# Patient Record
Sex: Female | Born: 1960 | Race: Black or African American | Hispanic: No | Marital: Single | Smoking: Former smoker
Health system: Southern US, Community
[De-identification: ages and names within clinical notes are randomized; demographics above are authoritative.]

## PROBLEM LIST (undated history)

## (undated) DIAGNOSIS — R519 Headache, unspecified: Secondary | ICD-10-CM

## (undated) HISTORY — PX: ABLATION: SHX5711

## (undated) HISTORY — PX: UTERINE FIBROID EMBOLIZATION: SHX825

---

## 2018-02-21 ENCOUNTER — Other Ambulatory Visit
Admission: RE | Admit: 2018-02-21 | Discharge: 2018-02-21 | Disposition: A | Discharge status: Home / Self Care | Payer: BLUE CROSS/BLUE SHIELD | Source: Ambulatory Visit | Attending: Pediatrics | Admitting: Pediatrics

## 2018-02-21 DIAGNOSIS — R42 Dizziness and giddiness: Secondary | ICD-10-CM | POA: Diagnosis present

## 2018-02-21 LAB — COMPREHENSIVE METABOLIC PANEL
ALT: 24 U/L (ref 0–44)
AST: 22 U/L (ref 15–41)
Albumin: 4.2 g/dL (ref 3.5–5.0)
Alkaline Phosphatase: 65 U/L (ref 38–126)
Anion gap: 4 — ABNORMAL LOW (ref 5–15)
BUN: 21 mg/dL — ABNORMAL HIGH (ref 6–20)
CO2: 28 mmol/L (ref 22–32)
Calcium: 9.2 mg/dL (ref 8.9–10.3)
Chloride: 109 mmol/L (ref 98–111)
Creatinine, Ser: 0.95 mg/dL (ref 0.44–1.00)
Glucose, Bld: 108 mg/dL — ABNORMAL HIGH (ref 70–99)
Potassium: 4.2 mmol/L (ref 3.5–5.1)
Sodium: 141 mmol/L (ref 135–145)
TOTAL PROTEIN: 7.2 g/dL (ref 6.5–8.1)
Total Bilirubin: 0.5 mg/dL (ref 0.3–1.2)

## 2018-09-07 ENCOUNTER — Other Ambulatory Visit: Payer: Self-pay | Admitting: Internal Medicine

## 2018-09-07 DIAGNOSIS — R109 Unspecified abdominal pain: Secondary | ICD-10-CM

## 2018-09-18 ENCOUNTER — Other Ambulatory Visit: Payer: Self-pay

## 2018-09-18 ENCOUNTER — Ambulatory Visit
Admission: RE | Admit: 2018-09-18 | Discharge: 2018-09-18 | Disposition: A | Discharge status: Home / Self Care | Payer: BC Managed Care – PPO | Source: Ambulatory Visit | Attending: Internal Medicine | Admitting: Internal Medicine

## 2018-09-18 DIAGNOSIS — R109 Unspecified abdominal pain: Secondary | ICD-10-CM

## 2019-02-12 NOTE — H&P (Signed)
Eileen Scott is a 59 y.o. female here for Discuss sono . Follow up from our consultation 2 days ago .  for Lower abdominal pain for the past several years . Pt has crampy , but no vaginal bleeding. + bladder pressure . She is s/p [place] for fibroids 2004 and subsequent Endometrial ablation in 2014 . Marland Kitchen Xray flat plate 1/20 showed calcified mass c/w fibroid. U/s today :   Fibroid ut seen 1 Rt t fundal=44 mm 2 rt post=48 mm 3 post=21 mm 4 lt ant=22 mm   Endometrium=6.61 mm  bil ovs wnl EMBX : insufficient for eval   PAP: neg   Past Medical History:  has a past medical history of Abnormal uterine bleeding, Allergic rhinitis, cause unspecified, Allergic state, Anemia (2017), Bell's palsy, Colon polyps (2010), Constipated, Controlled type 2 diabetes mellitus without complication, without long-term current use of insulin (CMS-HCC), GERD (gastroesophageal reflux disease), Hair loss, Hepatic cyst (2013), HSV (herpes simplex virus) anogenital infection, Hypertension, Insomnia, Lichen planus, Menopausal symptoms, Migraine headache, and Uterine fibroid.  Past Surgical History:  has a past surgical history that includes Uterine fibroid embolization (2004); Colonoscopy (2010); right carpal tunnel release; Gynecologic cryosurgery (1978); Tubal ligation (1995); dental implant; Endometrial ablation (2010); and Hysteroscopy [id number]). Family History: family history includes Alcohol abuse in her maternal uncle; Breast cancer in her maternal aunt; Cancer in her mother and paternal grandmother; Coronary Artery Disease (Blocked arteries around heart) in her paternal grandfather; Diabetes in her maternal grandfather and paternal uncle; Diabetes type II in her maternal grandfather; Esophageal cancer (age of onset: 50) in her father; Heart disease in her father and paternal grandfather; High blood pressure (Hypertension) in her father, mother, paternal uncle, and paternal uncle; Liver cancer in her maternal  uncle; Myocardial Infarction (Heart attack) in her paternal grandfather; Other in her father; Skin cancer in her maternal grandmother; Stroke in her paternal grandfather. Social History:  reports that she quit smoking about 21 years ago. Her smoking use included cigarettes. She quit after 15.00 years of use. She has never used smokeless tobacco. She reports current alcohol use. She reports that she does not use drugs. OB/GYN History:          OB History    Gravida  2   Para  1   Term  1   Preterm      AB  1   Living  1     SAB      TAB      Ectopic      Molar      Multiple      Live Births  1          Allergies: is allergic to ampicillin. Medications:  Current Outpatient Medications:  .  BIOTIN ORAL, Take 5,000 mcg by mouth., Disp: , Rfl:  .  cholecalciferol (VITAMIN D3) 1,000 unit capsule, Take 1,000 Units by mouth once daily., Disp: , Rfl:  .  clobetasol (CLOBEX) 0.05 % shampoo, APPLY TO DRY SCALP AND ALLOW TO REMAIN ON FOR 15 MINUTES, WASH OUT FOLLOWED BY REGUALR SHAMPOO AND/OR CONDITIONER ONCE TO TWICE WEEKLY, Disp: , Rfl: 1 .  finasteride (PROSCAR) 5 mg tablet, Take by mouth, Disp: , Rfl:  .  magnesium oxide (MAG-OX) 400 mg tablet, Take 400 mg by mouth once daily., Disp: , Rfl:  .  MULTIVITAMIN ORAL, Take by mouth once daily., Disp: , Rfl:  .  ascorbic acid, vitamin C, (VITAMIN C) 500 MG tablet, Take 500 mg by mouth once  daily, Disp: , Rfl:  .  fexofenadine (ALLEGRA) 180 MG tablet, Take 180 mg by mouth once daily, Disp: , Rfl:  .  iron, carbonyl 45 mg Tab, Take by mouth, Disp: , Rfl:  .  vitamin B complex (B COMPLEX ORAL), Take by mouth, Disp: , Rfl:   Review of Systems: General:                      No fatigue or weight loss Eyes:                           No vision changes Ears:                            No hearing difficulty Respiratory:                No cough or shortness of breath Pulmonary:                  No asthma or shortness of  breath Cardiovascular:           No chest pain, palpitations, dyspnea on exertion Gastrointestinal:          No abdominal bloating, chronic diarrhea, constipations, masses, pain or hematochezia Genitourinary:             No hematuria, dysuria, abnormal vaginal discharge,,+pelvic pain,+ bladder pressure Lymphatic:                   No swollen lymph nodes Musculoskeletal:         No muscle weakness Neurologic:                  No extremity weakness, syncope, seizure disorder Psychiatric:                  No history of depression, delusions or suicidal/homicidal ideation    Exam:      Vitals:   02/12/19 1511  BP: (!) 153/95    Body mass index is 28.49 kg/m.  WDWN/ black female in NAD   Lungs: CTA  CV : RRR without murmur   Breast: exam done in sitting and lying position : No dimpling or retraction, no dominant mass, no spontaneous discharge, no axillary adenopathy Neck:  no thyromegaly Abdomen: soft , no mass, normal active bowel sounds, non-tender, no rebound tenderness Pelvic: tanner stage 5 ,  External genitalia: vulva /labia no lesions Urethra: no prolapse Vagina: normal physiologic d/c Cervix: no lesions, no cervical motion tenderness  Uterus:14 weeks globular and firm, non-tender Adnexa:no mass, non-tender    Impression:   The encounter diagnosis was Uterine leiomyoma, unspecified location. Fibroid uterus with mass effect causing symptoms    Plan:   After a lengthy discussion the patient has elected for a LSH and BSO . She is aware or the rare event that if she has occult leiomyosarcoma that her cancer maybe upstage given morcellation is used .  Risks from 1/770- 1/10,000 Details of the procedure described and schematic showed . All questions answered . EMBX as above . Possible inadequate sample given prior endometrial ablation   Benefits and risks to surgery: The proposed benefit of the surgery has been discussed with the patient. The possible  risks include, but are not limited to: organ injury to the bowel , bladder, ureters, and major blood vessels and nerves. There is a possibility of additional surgeries resulting  from these injuries. There is also the risk of blood transfusion and the need to receive blood products during or after the procedure which may rarely lead to HIV or Hepatitis C infection. There is a risk of developing a deep venous thrombosis or a pulmonary embolism . There is the possibility of wound infection and also anesthetic complications, even the rare possibility of death. The patient understands these risks and wishes to proceed.       Orders Placed This Encounter  Procedures  . Pathology Report - [facility]    Endometrial Biopsy      Caroline Sauger, MD

## 2019-02-13 ENCOUNTER — Encounter
Admission: RE | Admit: 2019-02-13 | Discharge: 2019-02-13 | Disposition: A | Discharge status: Home / Self Care | Payer: BC Managed Care – PPO | Source: Ambulatory Visit | Attending: Obstetrics and Gynecology | Admitting: Obstetrics and Gynecology

## 2019-02-13 ENCOUNTER — Other Ambulatory Visit: Payer: Self-pay

## 2019-02-13 HISTORY — DX: Headache, unspecified: R51.9

## 2019-02-13 NOTE — Patient Instructions (Signed)
Your procedure is scheduled on: Monday February 19, 2019 Report to Day Surgery on the 2nd floor of the [facility]. To find out your arrival time, please call [phone] between 1PM - 3PM on: Friday February 16, 2019  REMEMBER: Instructions that are not followed completely may result in serious medical risk, up to and including death; or upon the discretion of your surgeon and anesthesiologist your surgery may need to be rescheduled.  Do not eat food after midnight the night before surgery.  No gum chewing, lozengers or hard candies.  You may however, drink CLEAR liquids up to 2 hours before you are scheduled to arrive for your surgery. Do not drink anything within 2 hours of the start of your surgery.  Clear liquids include: - water  - apple juice without pulp -  Clear gatorade - black coffee or tea (Do NOT add milk or creamers to the coffee or tea) Do NOT drink anything that is not on this list.  ENSURE PRE-SURGERY CARBOHYDRATE DRINK:  Complete drinking 2 hours before arriving at hospital  No Alcohol for 24 hours before or after surgery.  No Smoking including e-cigarettes for 24 hours prior to surgery.  No chewable tobacco products for at least 6 hours prior to surgery.  No nicotine patches on the day of surgery.  On the morning of surgery brush your teeth with toothpaste and water, you may rinse your mouth with mouthwash if you wish. Do not swallow any toothpaste or mouthwash.  Notify your doctor if there is any change in your medical condition (cold, fever, infection).  Do not wear jewelry, make-up, hairpins, clips or nail polish.  Do not wear lotions, powders, or perfumes.   Do not shave 48 hours prior to surgery.   Contacts and dentures may not be worn into surgery.  Do not bring valuables to the hospital, including drivers license, insurance or credit cards.  [facility] is not responsible for any belongings or valuables.   TAKE THESE MEDICATIONS THE MORNING OF  SURGERY: none  Use CHG Soap as directed on instruction sheet.  Follow recommendations from Cardiologist, Pulmonologist or PCP regarding stopping Aspirin, Coumadin, Plavix, Eliquis, Pradaxa, or Pletal.  Stop Anti-inflammatories (NSAIDS) such as Advil, Aleve, Ibuprofen, Motrin, Naproxen, Naprosyn and Aspirin based products such as Excedrin, Goodys Powder, BC Powder. (May take Tylenol or Acetaminophen if needed.)  Stop ANY OVER THE COUNTER supplements until after surgery. (May continue Vitamin D, Vitamin B, and multivitamin.)  Wear comfortable clothing (specific to your surgery type) to the hospital.  Plan for stool softeners for home use.  If you are being discharged the day of surgery, you will not be allowed to drive home. You will need a responsible adult to drive you home and stay with you that night.   If you are taking public transportation, you will need to have a responsible adult with you. Please confirm with your physician that it is acceptable to use public transportation.   Please call [phone] if you have any questions about these instructions.

## 2019-02-15 ENCOUNTER — Other Ambulatory Visit: Payer: Self-pay

## 2019-02-15 ENCOUNTER — Other Ambulatory Visit
Admission: RE | Admit: 2019-02-15 | Discharge: 2019-02-15 | Disposition: A | Discharge status: Home / Self Care | Payer: BC Managed Care – PPO | Source: Ambulatory Visit | Attending: Obstetrics and Gynecology | Admitting: Obstetrics and Gynecology

## 2019-02-15 DIAGNOSIS — Z01811 Encounter for preprocedural respiratory examination: Secondary | ICD-10-CM | POA: Insufficient documentation

## 2019-02-15 DIAGNOSIS — Z20822 Contact with and (suspected) exposure to covid-19: Secondary | ICD-10-CM | POA: Diagnosis not present

## 2019-02-15 LAB — BASIC METABOLIC PANEL
Anion gap: 10 (ref 5–15)
BUN: 14 mg/dL (ref 6–20)
CO2: 28 mmol/L (ref 22–32)
Calcium: 9.6 mg/dL (ref 8.9–10.3)
Chloride: 102 mmol/L (ref 98–111)
Creatinine, Ser: 0.96 mg/dL (ref 0.44–1.00)
GFR calc Af Amer: >60 mL/min (ref >60)
GFR calc non Af Amer: >60 mL/min (ref >60)
Glucose, Bld: 110 mg/dL — ABNORMAL HIGH (ref 70–99)
Potassium: 4.1 mmol/L (ref 3.5–5.1)
Sodium: 140 mmol/L (ref 135–145)

## 2019-02-15 LAB — TYPE AND SCREEN
ABO/RH(D): O POS
Antibody Screen: NEGATIVE

## 2019-02-15 LAB — CBC
HCT: 45 % (ref 36.0–46.0)
Hemoglobin: 15.2 g/dL — ABNORMAL HIGH (ref 12.0–15.0)
MCH: 29.5 pg (ref 26.0–34.0)
MCHC: 33.8 g/dL (ref 30.0–36.0)
MCV: 87.2 fL (ref 80.0–100.0)
Platelets: 248 10*3/uL (ref 150–400)
RBC: 5.16 MIL/uL — ABNORMAL HIGH (ref 3.87–5.11)
RDW: 12.4 % (ref 11.5–15.5)
WBC: 4 10*3/uL (ref 4.0–10.5)
nRBC: 0 % (ref 0.0–0.2)

## 2019-02-16 LAB — SARS CORONAVIRUS 2 (TAT 6-24 HRS): SARS Coronavirus 2: NEGATIVE

## 2019-02-19 ENCOUNTER — Encounter: Payer: Self-pay | Admitting: Obstetrics and Gynecology

## 2019-02-19 ENCOUNTER — Ambulatory Visit
Admission: RE | Admit: 2019-02-19 | Discharge: 2019-02-19 | Disposition: A | Discharge status: Home / Self Care | Payer: BC Managed Care – PPO | Attending: Obstetrics and Gynecology | Admitting: Obstetrics and Gynecology

## 2019-02-19 ENCOUNTER — Ambulatory Visit: Payer: BC Managed Care – PPO | Admitting: Certified Registered Nurse Anesthetist

## 2019-02-19 ENCOUNTER — Other Ambulatory Visit: Payer: Self-pay

## 2019-02-19 ENCOUNTER — Encounter
Admission: RE | Disposition: A | Discharge status: Home / Self Care | Payer: Self-pay | Source: Home / Self Care | Attending: Obstetrics and Gynecology

## 2019-02-19 DIAGNOSIS — E119 Type 2 diabetes mellitus without complications: Secondary | ICD-10-CM | POA: Diagnosis not present

## 2019-02-19 DIAGNOSIS — Z87891 Personal history of nicotine dependence: Secondary | ICD-10-CM | POA: Insufficient documentation

## 2019-02-19 DIAGNOSIS — J309 Allergic rhinitis, unspecified: Secondary | ICD-10-CM | POA: Insufficient documentation

## 2019-02-19 DIAGNOSIS — D259 Leiomyoma of uterus, unspecified: Secondary | ICD-10-CM | POA: Diagnosis present

## 2019-02-19 DIAGNOSIS — I10 Essential (primary) hypertension: Secondary | ICD-10-CM | POA: Insufficient documentation

## 2019-02-19 DIAGNOSIS — Z79899 Other long term (current) drug therapy: Secondary | ICD-10-CM | POA: Diagnosis not present

## 2019-02-19 DIAGNOSIS — D649 Anemia, unspecified: Secondary | ICD-10-CM | POA: Diagnosis not present

## 2019-02-19 HISTORY — PX: LAPAROSCOPIC SUPRACERVICAL HYSTERECTOMY: SHX5399

## 2019-02-19 HISTORY — PX: LAPAROSCOPIC BILATERAL SALPINGO OOPHERECTOMY: SHX5890

## 2019-02-19 LAB — ABO/RH: ABO/RH(D): O POS

## 2019-02-19 LAB — GLUCOSE, CAPILLARY: Glucose-Capillary: 151 mg/dL — ABNORMAL HIGH (ref 70–99)

## 2019-02-19 SURGERY — HYSTERECTOMY, SUPRACERVICAL, LAPAROSCOPIC
Anesthesia: General | Site: Abdomen

## 2019-02-19 MED ORDER — SILVER NITRATE-POT NITRATE 75-25 % EX MISC
CUTANEOUS | Status: AC
  Filled 2019-02-19: qty 20

## 2019-02-19 MED ORDER — CEFAZOLIN SODIUM-DEXTROSE 2-4 GM/100ML-% IV SOLN
INTRAVENOUS | Status: AC
  Filled 2019-02-19: qty 100

## 2019-02-19 MED ORDER — DEXAMETHASONE SODIUM PHOSPHATE 10 MG/ML IJ SOLN
INTRAMUSCULAR | Status: DC | PRN
  Administered 2019-02-19: 10 mg via INTRAVENOUS

## 2019-02-19 MED ORDER — ROCURONIUM BROMIDE 100 MG/10ML IV SOLN
INTRAVENOUS | Status: DC | PRN
  Administered 2019-02-19: 10 mg via INTRAVENOUS
  Administered 2019-02-19: 40 mg via INTRAVENOUS

## 2019-02-19 MED ORDER — ONDANSETRON HCL 4 MG/2ML IJ SOLN
INTRAMUSCULAR | Status: DC | PRN
  Administered 2019-02-19: 4 mg via INTRAVENOUS

## 2019-02-19 MED ORDER — ACETAMINOPHEN 500 MG PO TABS
ORAL_TABLET | ORAL | Status: AC
  Administered 2019-02-19: 1000 mg via ORAL
  Filled 2019-02-19: qty 2

## 2019-02-19 MED ORDER — CEFAZOLIN SODIUM-DEXTROSE 2-4 GM/100ML-% IV SOLN
2.0000 g | Freq: Once | INTRAVENOUS | Status: AC
  Administered 2019-02-19: 2 g via INTRAVENOUS

## 2019-02-19 MED ORDER — KETOROLAC TROMETHAMINE 30 MG/ML IJ SOLN
INTRAMUSCULAR | Status: DC | PRN
  Administered 2019-02-19: 15 mg via INTRAVENOUS

## 2019-02-19 MED ORDER — FAMOTIDINE 20 MG PO TABS
ORAL_TABLET | ORAL | Status: AC
  Filled 2019-02-19: qty 1

## 2019-02-19 MED ORDER — ACETAMINOPHEN 10 MG/ML IV SOLN
INTRAVENOUS | Status: DC | PRN
  Administered 2019-02-19: 1000 mg via INTRAVENOUS

## 2019-02-19 MED ORDER — FAMOTIDINE 20 MG PO TABS
20.0000 mg | ORAL_TABLET | Freq: Once | ORAL | Status: AC
  Administered 2019-02-19: 20 mg via ORAL

## 2019-02-19 MED ORDER — MIDAZOLAM HCL 2 MG/2ML IJ SOLN
INTRAMUSCULAR | Status: DC | PRN
  Administered 2019-02-19: 2 mg via INTRAVENOUS

## 2019-02-19 MED ORDER — ACETAMINOPHEN 10 MG/ML IV SOLN
INTRAVENOUS | Status: AC
  Filled 2019-02-19: qty 100

## 2019-02-19 MED ORDER — FENTANYL CITRATE (PF) 100 MCG/2ML IJ SOLN
INTRAMUSCULAR | Status: AC
  Filled 2019-02-19: qty 2

## 2019-02-19 MED ORDER — ACETAMINOPHEN 500 MG PO TABS: 1000.0000 mg | ORAL_TABLET | ORAL | Status: AC

## 2019-02-19 MED ORDER — LIDOCAINE HCL (CARDIAC) PF 100 MG/5ML IV SOSY
PREFILLED_SYRINGE | INTRAVENOUS | Status: DC | PRN
  Administered 2019-02-19: 80 mg via INTRAVENOUS

## 2019-02-19 MED ORDER — PHENYLEPHRINE HCL (PRESSORS) 10 MG/ML IV SOLN
INTRAVENOUS | Status: DC | PRN
  Administered 2019-02-19 (×2): 100 ug via INTRAVENOUS

## 2019-02-19 MED ORDER — GABAPENTIN 300 MG PO CAPS
300.0000 mg | ORAL_CAPSULE | ORAL | Status: AC
  Administered 2019-02-19: 300 mg via ORAL

## 2019-02-19 MED ORDER — MIDAZOLAM HCL 2 MG/2ML IJ SOLN
INTRAMUSCULAR | Status: AC
  Filled 2019-02-19: qty 2

## 2019-02-19 MED ORDER — FENTANYL CITRATE (PF) 100 MCG/2ML IJ SOLN: 25.0000 ug | INTRAMUSCULAR | Status: DC | PRN

## 2019-02-19 MED ORDER — SUGAMMADEX SODIUM 200 MG/2ML IV SOLN
INTRAVENOUS | Status: DC | PRN
  Administered 2019-02-19: 142.4 mg via INTRAVENOUS

## 2019-02-19 MED ORDER — ONDANSETRON HCL 4 MG/2ML IJ SOLN: 4.0000 mg | Freq: Once | INTRAMUSCULAR | Status: DC | PRN

## 2019-02-19 MED ORDER — BUPIVACAINE HCL (PF) 0.5 % IJ SOLN
INTRAMUSCULAR | Status: AC
  Filled 2019-02-19: qty 30

## 2019-02-19 MED ORDER — LACTATED RINGERS IV SOLN: INTRAVENOUS | Status: DC

## 2019-02-19 MED ORDER — FENTANYL CITRATE (PF) 100 MCG/2ML IJ SOLN
INTRAMUSCULAR | Status: DC | PRN
  Administered 2019-02-19: 100 ug via INTRAVENOUS

## 2019-02-19 MED ORDER — BUPIVACAINE HCL 0.5 % IJ SOLN
INTRAMUSCULAR | Status: DC | PRN
  Administered 2019-02-19: 15 mL

## 2019-02-19 MED ORDER — SODIUM CHLORIDE 0.9 % IV SOLN
INTRAVENOUS | Status: DC | PRN
  Administered 2019-02-19: 10:00:00 40 ug/min via INTRAVENOUS

## 2019-02-19 MED ORDER — GABAPENTIN 300 MG PO CAPS
ORAL_CAPSULE | ORAL | Status: AC
  Filled 2019-02-19: qty 1

## 2019-02-19 MED ORDER — SILVER NITRATE-POT NITRATE 75-25 % EX MISC
CUTANEOUS | Status: DC | PRN
  Administered 2019-02-19: 1

## 2019-02-19 MED ORDER — PROPOFOL 10 MG/ML IV BOLUS
INTRAVENOUS | Status: DC | PRN
  Administered 2019-02-19: 150 mg via INTRAVENOUS

## 2019-02-19 SURGICAL SUPPLY — 56 items
ANCHOR TIS RET SYS 1550ML (BAG) ×3 IMPLANT
BAG URINE DRAIN 2000ML AR STRL (UROLOGICAL SUPPLIES) ×3 IMPLANT
BLADE OSC/SAGITTAL MD 5.5X18 (BLADE) ×3 IMPLANT
BLADE SAW 2.5X18X0.5 (BLADE) ×3 IMPLANT
BLADE SURG SZ11 CARB STEEL (BLADE) ×12 IMPLANT
CANISTER SUCT 1200ML W/VALVE (MISCELLANEOUS) ×3 IMPLANT
CATH FOLEY 2WAY  5CC 16FR (CATHETERS) ×1
CATH ROBINSON RED A/P 16FR (CATHETERS) ×3 IMPLANT
CATH URTH 16FR FL 2W BLN LF (CATHETERS) ×2 IMPLANT
CHLORAPREP W/TINT 26 (MISCELLANEOUS) ×3 IMPLANT
COVER WAND RF STERILE (DRAPES) ×3 IMPLANT
DERMABOND ADVANCED (GAUZE/BANDAGES/DRESSINGS) ×1
DERMABOND ADVANCED .7 DNX12 (GAUZE/BANDAGES/DRESSINGS) ×2 IMPLANT
DRSG TEGADERM 2-3/8X2-3/4 SM (GAUZE/BANDAGES/DRESSINGS) ×3 IMPLANT
GLOVE BIO SURGEON STRL SZ8 (GLOVE) ×15 IMPLANT
GOWN STRL REUS W/ TWL LRG LVL3 (GOWN DISPOSABLE) ×4 IMPLANT
GOWN STRL REUS W/ TWL XL LVL3 (GOWN DISPOSABLE) ×2 IMPLANT
GOWN STRL REUS W/TWL LRG LVL3 (GOWN DISPOSABLE) ×2
GOWN STRL REUS W/TWL XL LVL3 (GOWN DISPOSABLE) ×1
GRASPER SUT TROCAR 14GX15 (MISCELLANEOUS) ×6 IMPLANT
IRRIGATION STRYKERFLOW (MISCELLANEOUS) ×2 IMPLANT
IRRIGATOR STRYKERFLOW (MISCELLANEOUS) ×3
IV LACTATED RINGERS 1000ML (IV SOLUTION) IMPLANT
IV NS 1000ML (IV SOLUTION)
IV NS 1000ML BAXH (IV SOLUTION) IMPLANT
KIT PINK PAD W/HEAD ARE REST (MISCELLANEOUS) ×3
KIT PINK PAD W/HEAD ARM REST (MISCELLANEOUS) ×2 IMPLANT
KIT TURNOVER CYSTO (KITS) ×3 IMPLANT
KITTNER LAPARASCOPIC 5X40 (MISCELLANEOUS) ×3 IMPLANT
LABEL OR SOLS (LABEL) ×3 IMPLANT
MORCELLATOR XCISE  COR (MISCELLANEOUS) ×1
MORCELLATOR XCISE COR (MISCELLANEOUS) ×2 IMPLANT
NS IRRIG 500ML POUR BTL (IV SOLUTION) ×3 IMPLANT
PACK GYN LAPAROSCOPIC (MISCELLANEOUS) ×3 IMPLANT
PAD OB MATERNITY 4.3X12.25 (PERSONAL CARE ITEMS) ×3 IMPLANT
PAD PREP 24X41 OB/GYN DISP (PERSONAL CARE ITEMS) ×3 IMPLANT
POUCH SPECIMEN RETRIEVAL 10MM (ENDOMECHANICALS) IMPLANT
RETRACTOR RING XSMALL (MISCELLANEOUS) ×2 IMPLANT
RTRCTR WOUND ALEXIS 13CM XS SH (MISCELLANEOUS) ×3
SET CYSTO W/LG BORE CLAMP LF (SET/KITS/TRAYS/PACK) IMPLANT
SET TUBE SMOKE EVAC HIGH FLOW (TUBING) ×3 IMPLANT
SHEARS HARMONIC ACE PLUS 36CM (ENDOMECHANICALS) ×3 IMPLANT
SLEEVE ENDOPATH XCEL 5M (ENDOMECHANICALS) ×3 IMPLANT
SOLUTION ELECTROLUBE (MISCELLANEOUS) ×3 IMPLANT
SPONGE GAUZE 2X2 8PLY STRL LF (GAUZE/BANDAGES/DRESSINGS) ×3 IMPLANT
STRIP CLOSURE SKIN 1/2X4 (GAUZE/BANDAGES/DRESSINGS) IMPLANT
STRIP CLOSURE SKIN 1/4X4 (GAUZE/BANDAGES/DRESSINGS) ×3 IMPLANT
SUT VIC AB 0 CT1 36 (SUTURE) ×3 IMPLANT
SUT VIC AB 2-0 UR6 27 (SUTURE) ×6 IMPLANT
SUT VIC AB 4-0 SH 27 (SUTURE) ×2
SUT VIC AB 4-0 SH 27XANBCTRL (SUTURE) ×4 IMPLANT
SWABSTK COMLB BENZOIN TINCTURE (MISCELLANEOUS) ×3 IMPLANT
SYR 10ML LL (SYRINGE) ×3 IMPLANT
SYR 20ML LL LF (SYRINGE) ×3 IMPLANT
TROCAR ENDO BLADELESS 11MM (ENDOMECHANICALS) ×3 IMPLANT
TROCAR XCEL NON-BLD 5MMX100MML (ENDOMECHANICALS) ×3 IMPLANT

## 2019-02-19 NOTE — Brief Op Note (Signed)
02/19/2019  12:02 PM  PATIENT:  Eileen Scott  59 y.o. female  PRE-OPERATIVE DIAGNOSIS:  symptomatic fibroids  POST-OPERATIVE DIAGNOSIS:  Symptomatic calcified fibroids  PROCEDURE:  Procedure(s): LAPAROSCOPIC SUPRACERVICAL HYSTERECTOMY (N/A) LAPAROSCOPIC BILATERAL SALPINGO OOPHORECTOMY (Bilateral)  SURGEON:  Surgeon(s) and Role:    * Quenton Recendez, Gwen Her, MD - Primary    * Benjaman Kindler, MD - Assisting  PHYSICIAN ASSISTANT:   ASSISTANTS: none   ANESTHESIA:   general  EBL:  25 mL   BLOOD ADMINISTERED:none  DRAINS: none   LOCAL MEDICATIONS USED:  MARCAINE     SPECIMEN:  Source of Specimen:  uterus with calified morcellated fibroids, bilateral fallopian tubes and ovaries   DISPOSITION OF SPECIMEN:  PATHOLOGY  COUNTS:  YES  TOURNIQUET:  * No tourniquets in log *  DICTATION: .Other Dictation: Dictation Number verbal  PLAN OF CARE: Discharge to home after PACU  PATIENT DISPOSITION:  PACU - hemodynamically stable.   Delay start of Pharmacological VTE agent (>24hrs) due to surgical blood loss or risk of bleeding: not applicable

## 2019-02-19 NOTE — Anesthesia Preprocedure Evaluation (Signed)
Anesthesia Evaluation  Patient identified by MRN, date of birth, ID band Patient awake    Reviewed: Allergy & Precautions, NPO status , Patient's Chart, lab work & pertinent test results  Airway Mallampati: III       Dental   Pulmonary former smoker,    Pulmonary exam normal        Cardiovascular negative cardio ROS Normal cardiovascular exam     Neuro/Psych  Headaches, negative psych ROS   GI/Hepatic negative GI ROS, Neg liver ROS,   Endo/Other  negative endocrine ROS  Renal/GU negative Renal ROS  Female GU complaint     Musculoskeletal negative musculoskeletal ROS (+)   Abdominal   Peds negative pediatric ROS (+)  Hematology negative hematology ROS (+)   Anesthesia Other Findings   Reproductive/Obstetrics                             Anesthesia Physical Anesthesia Plan  ASA: II  Anesthesia Plan: General   Post-op Pain Management:    Induction: Intravenous  PONV Risk Score and Plan:   Airway Management Planned: Oral ETT  Additional Equipment:   Intra-op Plan:   Post-operative Plan: Extubation in OR  Informed Consent: I have reviewed the patients History and Physical, chart, labs and discussed the procedure including the risks, benefits and alternatives for the proposed anesthesia with the patient or authorized representative who has indicated his/her understanding and acceptance.       Plan Discussed with: CRNA and Surgeon  Anesthesia Plan Comments:         Anesthesia Quick Evaluation

## 2019-02-19 NOTE — Anesthesia Procedure Notes (Signed)
Procedure Name: Intubation Date/Time: 02/19/2019 9:13 AM Performed by: Willette Alma, CRNA Pre-anesthesia Checklist: Patient identified, Patient being monitored, Timeout performed, Emergency Drugs available and Suction available Patient Re-evaluated:Patient Re-evaluated prior to induction Oxygen Delivery Method: Circle system utilized Preoxygenation: Pre-oxygenation with 100% oxygen Induction Type: IV induction Ventilation: Mask ventilation without difficulty Laryngoscope Size: Mac and 3 Grade View: Grade II Tube type: Oral Tube size: 7.0 mm Number of attempts: 1 Airway Equipment and Method: Stylet Placement Confirmation: ETT inserted through vocal cords under direct vision,  positive ETCO2 and breath sounds checked- equal and bilateral Secured at: 21 cm Tube secured with: Tape Dental Injury: Teeth and Oropharynx as per pre-operative assessment  Comments: Atraumatic intubation. Lips and teeth as before. Soft bite block in place after ETT secured.

## 2019-02-19 NOTE — Progress Notes (Signed)
Ready for [facility] and BSO . Labs reviewed . All questions answered . Proceed

## 2019-02-19 NOTE — Op Note (Signed)
NAME: Eileen Scott, MAUSS MEDICAL RECORD [id number] ACCOUNT [id number] DATE OF BIRTH:02-May-1960 FACILITY: [facility] LOCATION: [facility] PHYSICIAN:Odile Veloso Josefine Class, MD  OPERATIVE REPORT  DATE OF PROCEDURE:  02/19/2019  PREOPERATIVE DIAGNOSIS:  Symptomatic fibroid uterus.  POSTOPERATIVE DIAGNOSIS:  Symptomatic calcified fibroid uterus.  PROCEDURE: 1.  Laparoscopic supracervical hysterectomy with bilateral salpingo-oophorectomy. 2.  Morcellation of calcified fibroids with Total Performance System as portion of the morcellation.  SURGEON:  Laverta Baltimore, MD.  FIRST ASSISTANT:  Benjaman Kindler, MD.  ANESTHESIA:  General endotracheal anesthesia.  INDICATIONS:  A 59 year old female who had previously undergone uterine artery embolization in 2004 and subsequent endometrial ablation in 2014.  The patient had continuous pelvic pressure and abdominal pain secondary to fibroid uterus with large  calcification of fibroids.  DESCRIPTION OF PROCEDURE:  After adequate general endotracheal anesthesia, the patient was placed in dorsal supine position, legs placed in the [place].  The patient's abdomen, perineum and vagina were prepped and draped in normal sterile fashion.   Then, a timeout was performed.  The patient did receive 2 g IV Ancef prior to commencement of the case.  Foley catheter was placed during the procedure yielded an initial 100 mL of clear urine.  A speculum was placed in the anterior cervix was grasped  with a single-tooth tenaculum and a uterine sound was placed into the endometrial cavity to be used for uterine manipulation during the procedure.  Gloves were changed.  Attention was directed to the patient's abdomen.  A 5 mm infraumbilical incision was  made and the 5 mm laparoscope was advanced into the abdominal cavity under direct visualization with the Optiview cannula.  Second port site was placed in the left lower quadrant 3 cm medial to the left anterior  iliac spine.  An 11 mm trocar was  advanced under direct visualization.  A third port site was placed on the right lower quadrant, again 3 cm medial to the right anterior iliac spine.  A 5 mm trocar was advanced under direct visualization.  Initial impression revealed a 13-14 week size  uterus with 2 large prominent fibroids, very firm with manipulation with the graspers.  The ureters were identified bilaterally with normal peristaltic activity.  Of note, the sigmoid colon had some increased pigmentation on the serosal surface.   Attention was directed to the patient's left adnexa.  The left infundibulopelvic ligament was cauterized with Kleppinger cautery and then dissected with the Harmonic scalpel.  Broad ligament on the left was opened.  Ultimately, the left uterine artery  was skeletonized and cauterized and transected.  Bladder flap was created and the bladder was reflected off the lower cervix.  Similar procedure was repeated on the patient's right side.  Again, the right infundibulopelvic ligament was cauterized and  transected.  The right broad ligament was opened and the right uterine artery was skeletonized, cauterized and transected.  Harmonic scalpel was then used to transect the cervix at the level of the uterosacral ligaments.  The uterine sound was then  removed and the remaining endocervical canal was cauterized with the Kleppinger.  The morcellator was then brought up and advanced into the left lower port site.  Morcellation took place and ultimately the 2 fibroids, approximately 5 and 4 cm each were  left behind given the morcellator could not morcellate the rock-hard fibroids.  An Endobag was brought up into the field and the 2 large fibroids and 1 smaller calcified fibroid were placed in the bag and brought up to the left lower port  site.  An  Ubaldo Glassing was brought in to aid in visualization.  The fibroids were grasped with a double-tooth tenaculum and a scalpel could not penetrate the  cortex of this calcified fibroid.  Orthopedic Total Performance System bone cutter was then utilized to help  dissect and morcellate these calcified fibroids.  This morcellation took 100 minutes out of the total operating time of 140 minutes.  Ultimately, the 3 fibroids that were in the bag were brought through the lower port site and the bag was removed.  The  left approximately 4 cm incision was then closed with a 2-0 Vicryl suture to incorporate the fascia.  The patient's abdomen was reinsufflated and there were no defects in the fascia noted.  The patient's abdomen was copiously irrigated and there was one  small 1 x 1 cm calcified remnant that was identified.  A #11 trocar was placed through the right lower port site to facilitate with the removal of this piece.  This was accomplished and the ureters again showed normal peristaltic activity.  Upper abdomen  appeared normal.  The Carter-Thomason apparatus was brought up and the right lower port site fascia was closed with the PMI with a 2-0 Vicryl suture.  Good repair of the defect.  Pictures were taken.  Pressure was lowered to 7 mmHg and good hemostasis  was noted.  The patient's abdomen was deflated and the ports sites were closed with interrupted 4-0 Vicryl and the left lower port site was closed with a 4-0 Vicryl subcuticular stitch.  Good cosmetic effect.  Dressings applied.  The Foley catheter was  removed with a total urine output of  150 mL, including what was removed at the beginning of the case.  The anterior tenaculum was removed from the cervix and silver nitrate was used to control hemostasis.  Good hemostasis was noted.  COMPLICATIONS:  There were no complications.  ESTIMATED BLOOD LOSS:  25 mL.  INTRAOPERATIVE FLUIDS:  700 mL.  URINE OUTPUT:  150 mL.  The patient did receive 30 mg intravenous Toradol at the end of the case.  DISPOSITION:  The patient was taken to recovery room in good condition.  VN/NUANCE  D:02/19/2019  T:02/19/2019 JOB:[id number]

## 2019-02-19 NOTE — Transfer of Care (Signed)
Immediate Anesthesia Transfer of Care Note  Patient: Eileen Scott  Procedure(s) Performed: LAPAROSCOPIC SUPRACERVICAL HYSTERECTOMY (N/A Abdomen) LAPAROSCOPIC BILATERAL SALPINGO OOPHORECTOMY (Bilateral )  Patient Location: PACU  Anesthesia Type:General  Level of Consciousness: awake, alert  and oriented  Airway & Oxygen Therapy: Patient Spontanous Breathing and Patient connected to face mask oxygen  Post-op Assessment: Report given to RN and Post -op Vital signs reviewed and stable  Post vital signs: Reviewed and stable  Last Vitals:  Vitals Value Taken Time  BP 129/58 02/19/19 1215  Temp    Pulse 88 02/19/19 1216  Resp 12 02/19/19 1216  SpO2 100 % 02/19/19 1216  Vitals shown include unvalidated device data.  Last Pain:  Vitals:   02/19/19 0829  TempSrc: Tympanic  PainSc: 0-No pain         Complications: No apparent anesthesia complications

## 2019-02-19 NOTE — Discharge Instructions (Signed)

## 2019-02-20 NOTE — Anesthesia Postprocedure Evaluation (Signed)
Anesthesia Post Note  Patient: Melia B Campione  Procedure(s) Performed: LAPAROSCOPIC SUPRACERVICAL HYSTERECTOMY (N/A Abdomen) LAPAROSCOPIC BILATERAL SALPINGO OOPHORECTOMY (Bilateral )  Patient location during evaluation: PACU Anesthesia Type: General Level of consciousness: awake and alert and oriented Pain management: pain level controlled Vital Signs Assessment: post-procedure vital signs reviewed and stable Respiratory status: spontaneous breathing Cardiovascular status: blood pressure returned to baseline Anesthetic complications: no     Last Vitals:  Vitals:   02/19/19 1345 02/19/19 1424  BP: 104/85 119/61  Pulse: 83 82  Resp: 12 18  Temp: 36.6 C   SpO2: 100% 100%    Last Pain:  Vitals:   02/20/19 0828  TempSrc:   PainSc: 1                  Tyshon Fanning

## 2019-02-22 LAB — SURGICAL PATHOLOGY

## 2019-06-18 ENCOUNTER — Other Ambulatory Visit: Payer: Self-pay | Admitting: Family Medicine

## 2019-06-18 DIAGNOSIS — R413 Other amnesia: Secondary | ICD-10-CM

## 2019-06-18 DIAGNOSIS — G8929 Other chronic pain: Secondary | ICD-10-CM

## 2019-06-18 DIAGNOSIS — R42 Dizziness and giddiness: Secondary | ICD-10-CM

## 2019-06-29 ENCOUNTER — Other Ambulatory Visit: Payer: Self-pay

## 2019-06-29 ENCOUNTER — Ambulatory Visit
Admission: RE | Admit: 2019-06-29 | Discharge: 2019-06-29 | Disposition: A | Discharge status: Home / Self Care | Payer: BC Managed Care – PPO | Source: Ambulatory Visit | Attending: Family Medicine | Admitting: Family Medicine

## 2019-06-29 DIAGNOSIS — R42 Dizziness and giddiness: Secondary | ICD-10-CM | POA: Insufficient documentation

## 2019-06-29 DIAGNOSIS — R413 Other amnesia: Secondary | ICD-10-CM

## 2019-06-29 DIAGNOSIS — G8929 Other chronic pain: Secondary | ICD-10-CM | POA: Diagnosis present

## 2019-06-29 DIAGNOSIS — R519 Headache, unspecified: Secondary | ICD-10-CM | POA: Diagnosis present

## 2019-06-29 MED ORDER — GADOBUTROL 1 MMOL/ML IV SOLN
7.0000 mL | Freq: Once | INTRAVENOUS | Status: AC | PRN
  Administered 2019-06-29: 7 mL via INTRAVENOUS

## 2021-09-21 IMAGING — MR MR HEAD WO/W CM
14 series · 48 of 48 positions shown · IV contrast (gadavist)
Comparison: None.

CLINICAL DATA: Chronic headaches.

EXAM:
MRI HEAD WITHOUT AND WITH CONTRAST
TECHNIQUE: Multiplanar, multiecho pulse sequences of the brain and surrounding
structures were obtained without and with intravenous contrast.
CONTRAST:  7mL GADAVIST GADOBUTROL 1 MMOL/ML IV SOLN

[Series 5: ax dwi_tracew · axial · 3.0mm · 0.60mm/px · z∈[-147,+6]mm · 2 of 48 slices shown]
[im 1/48]
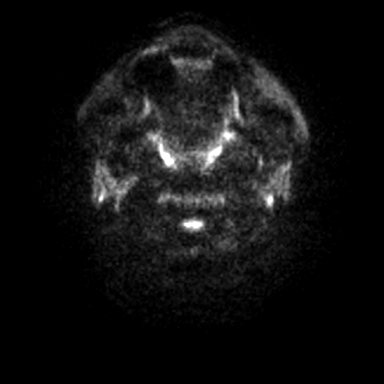
[im 48/48]
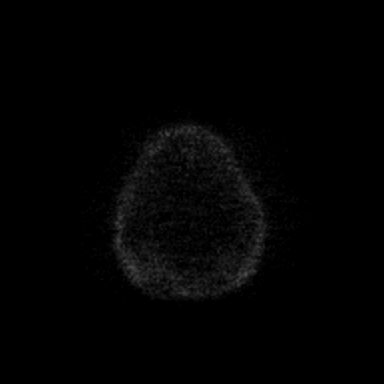

[Series 6: ax dwi_adc · axial · 3.0mm · 0.60mm/px · z∈[-147,+6]mm · 3 of 48 slices shown]
[im 1/48]
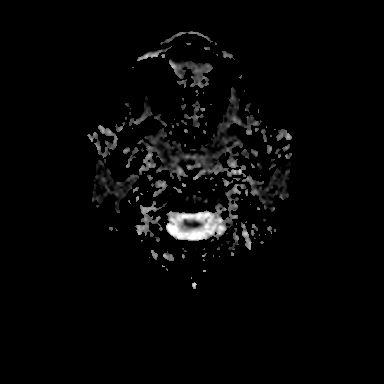
[im 24/48]
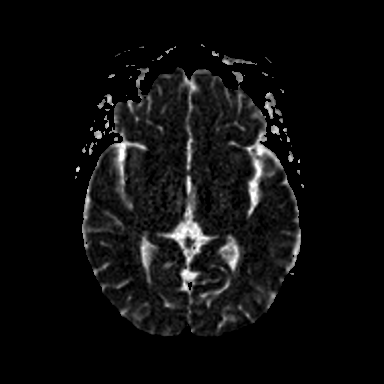
[im 48/48]
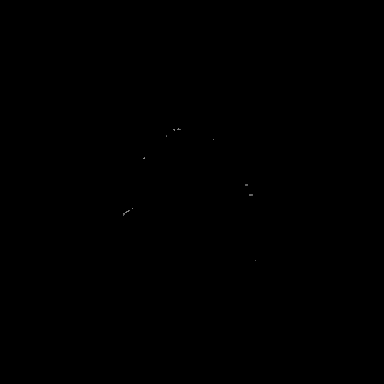

[Series 7: cor dwi_tracew · coronal · 5.0mm · 0.60mm/px · 2 of 34 slices shown]
[im 1/34]
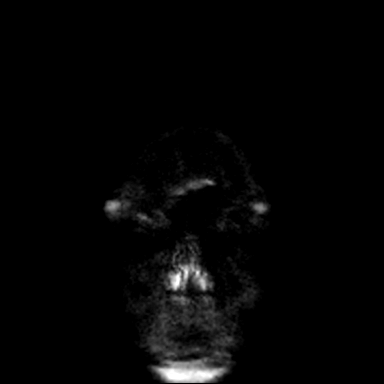
[im 34/34]
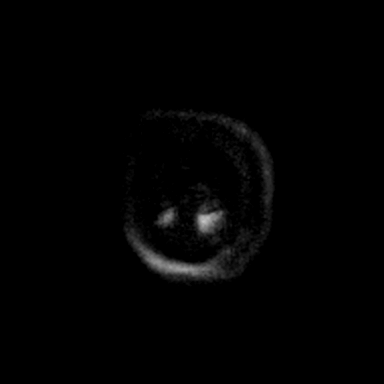

[Series 8: cor dwi_adc · coronal · 5.0mm · 0.60mm/px · 2 of 34 slices shown]
[im 1/34]
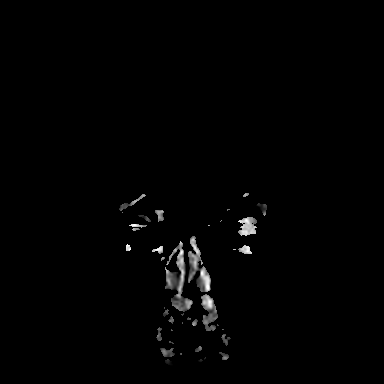
[im 34/34]
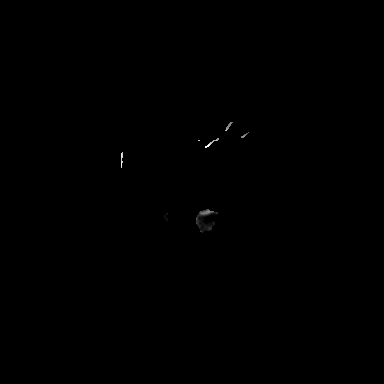

[Series 9: T1 · sagittal · 5.0mm · 0.62mm/px · 1 of 21 slices shown (1 of 2)]
[im 1/21]
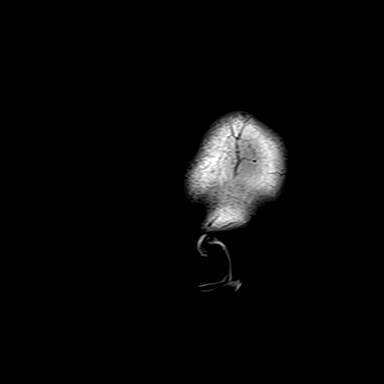

[Series 10: T2 · axial · 5.0mm · 0.53mm/px · 1 of 25 slices shown]
[im 1/25]
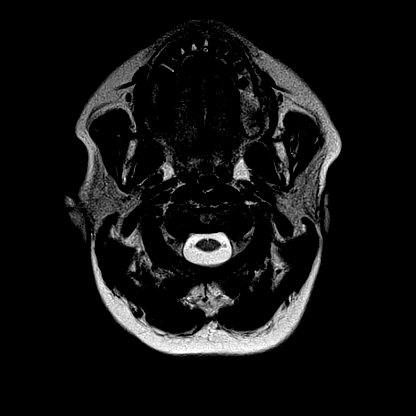

[Series 11: mag_images · axial · 3.0mm · 0.90mm/px · z∈[-159,+17]mm · 4 of 60 slices shown]
[im 1/60]
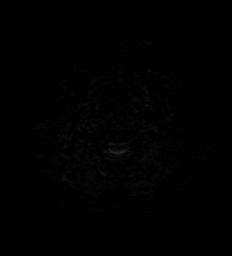
[im 20/60]
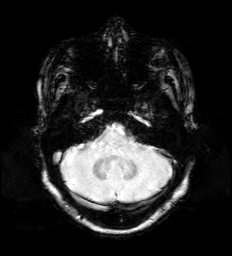
[im 40/60]
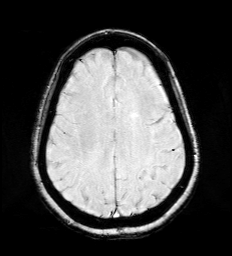
[im 60/60]
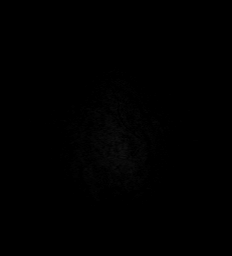

[Series 12: pha_images · axial · 3.0mm · 0.90mm/px · z∈[-159,+17]mm · 4 of 60 slices shown]
[im 1/60]
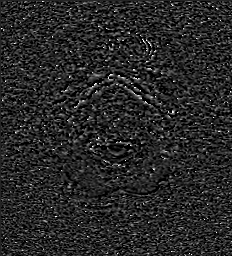
[im 20/60]
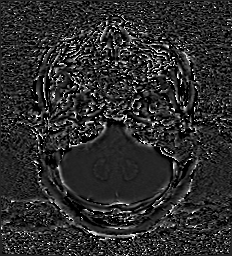
[im 40/60]
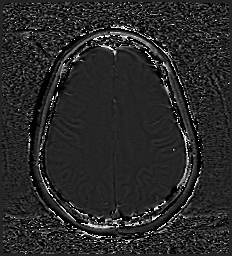
[im 60/60]
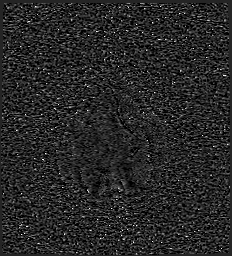

[Series 13: swi_images · axial · 3.0mm · 0.90mm/px · z∈[-159,+17]mm · 4 of 60 slices shown]
[im 1/60]
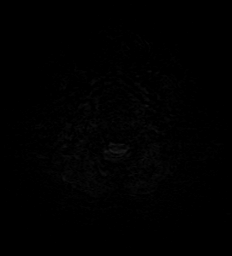
[im 20/60]
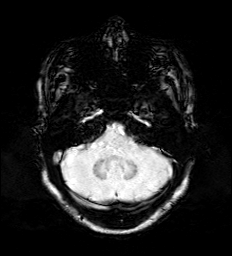
[im 40/60]
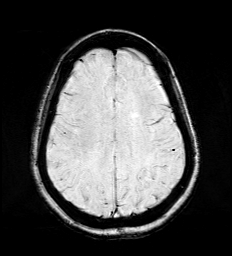
[im 60/60]
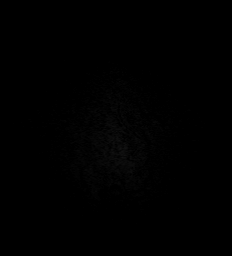

[Series 15: FLAIR · axial · 3.0mm · 0.53mm/px · z∈[-150,+10]mm · 3 of 55 slices shown]
[im 1/55]
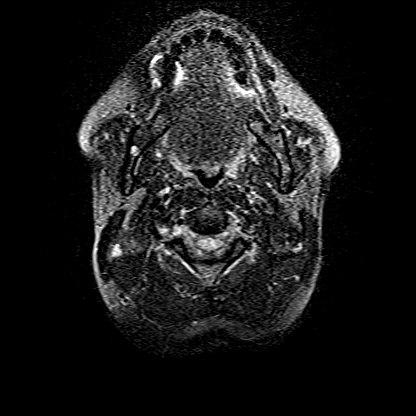
[im 28/55]
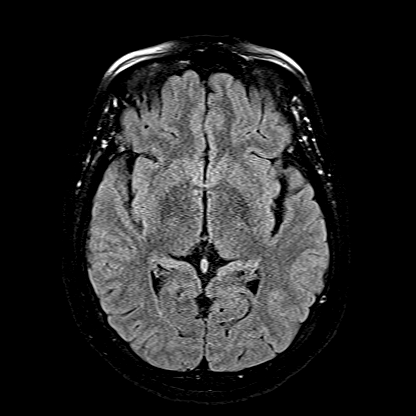
[im 55/55]
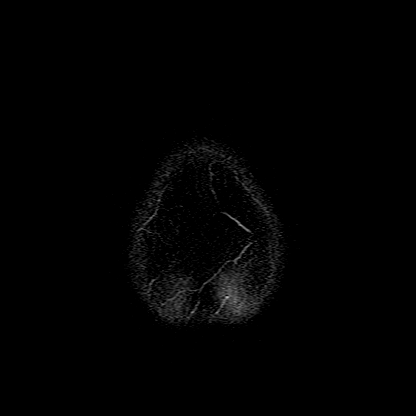

[Series 16: T1 · axial · 1.0mm · 0.98mm/px · z∈[-151,+7]mm · 9 of 160 slices shown (2 of 2)]
[im 1/160]
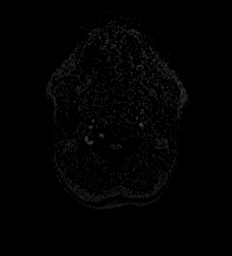
[im 20/160]
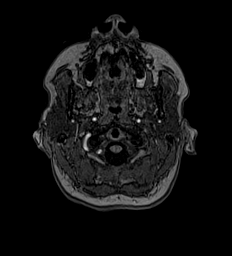
[im 40/160]
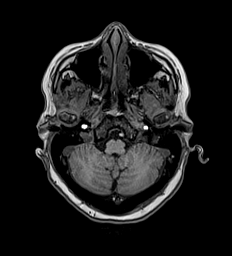
[im 60/160]
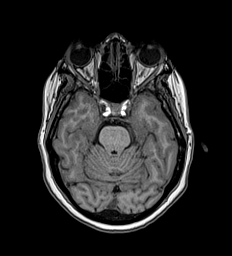
[im 80/160]
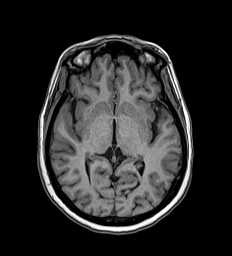
[im 100/160]
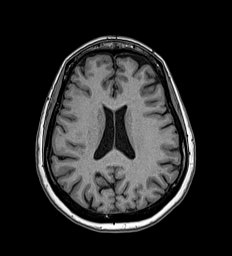
[im 120/160]
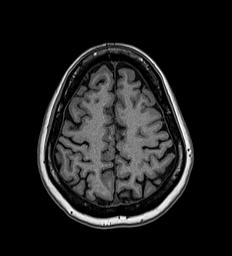
[im 140/160]
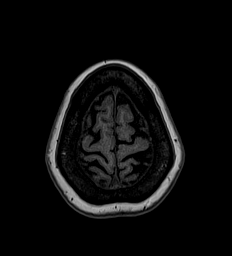
[im 160/160]
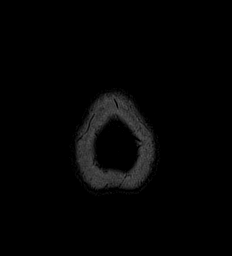

[Series 17: T2 post-contrast · coronal · 5.0mm · 0.57mm/px · 2 of 27 slices shown]
[im 1/27]
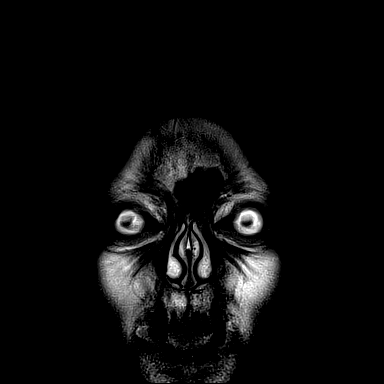
[im 27/27]
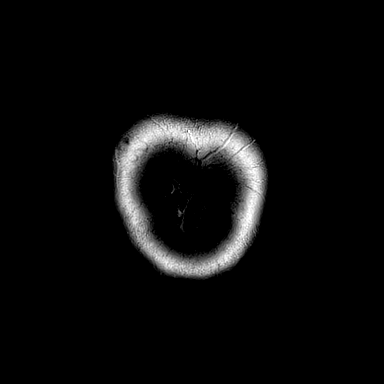

[Series 18: T1 post-contrast · axial · 1.0mm · 0.98mm/px · z∈[-151,+7]mm · 9 of 160 slices shown (1 of 2)]
[im 1/160]
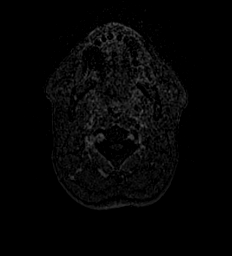
[im 20/160]
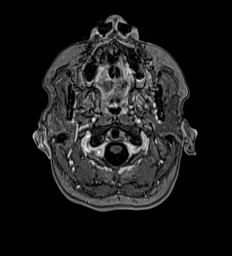
[im 40/160]
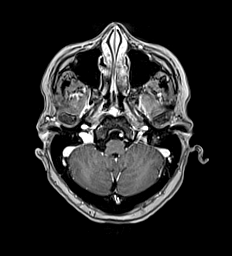
[im 60/160]
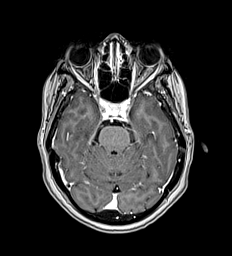
[im 80/160]
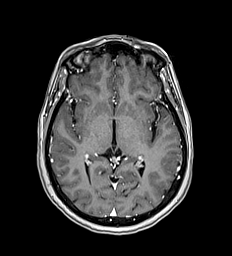
[im 100/160]
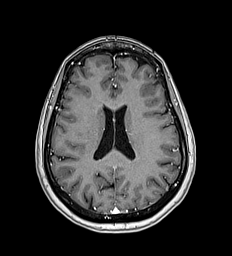
[im 120/160]
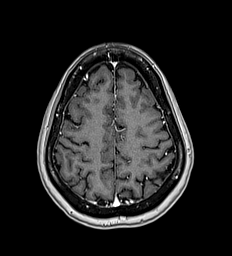
[im 140/160]
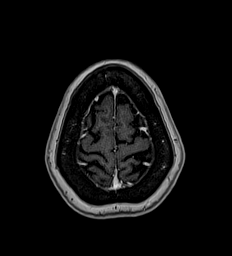
[im 160/160]
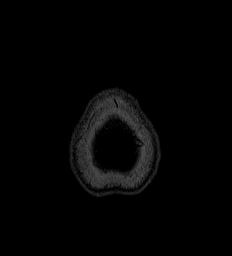

[Series 19: T1 post-contrast · coronal · 5.0mm · 0.57mm/px · 2 of 27 slices shown (2 of 2)]
[im 1/27]
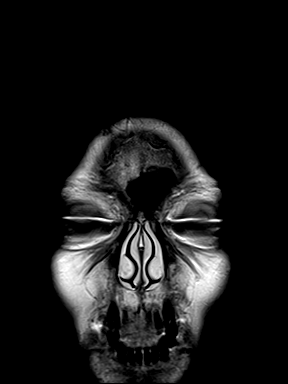
[im 27/27]
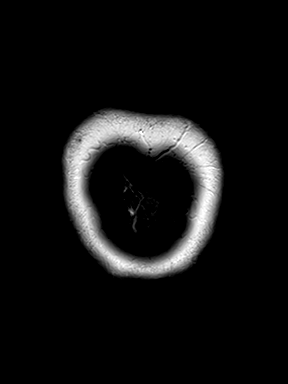

[48 of 48 positions shown; findings below may reference images not displayed]

FINDINGS: Brain: Minimal scattered T2/FLAIR hyperintense foci involving the
periventricular and subcortical white matter are nonspecific however
commonly associated with chronic microvascular ischemic changes. No
acute infarct or intracranial hemorrhage. No midline shift,
ventriculomegaly or extra-axial fluid collection. No mass lesion. No
abnormal enhancement.

Vascular: Normal flow voids.

Skull and upper cervical spine: Normal marrow signal.

Sinuses/Orbits: Normal orbits. Clear paranasal sinuses. No mastoid
effusion.

Other: None.
IMPRESSION: Minimal chronic microvascular ischemic changes.

Otherwise unremarkable MRI brain.
